# Patient Record
Sex: Male | Born: 1975 | Race: White | Hispanic: No | Marital: Married | Smoking: Never smoker
Health system: Southern US, Community
[De-identification: ages and names within clinical notes are randomized; demographics above are authoritative.]

## PROBLEM LIST (undated history)

## (undated) HISTORY — PX: FRACTURE SURGERY: SHX138

## (undated) HISTORY — PX: BACK SURGERY: SHX140

---

## 1999-05-09 ENCOUNTER — Emergency Department (HOSPITAL_COMMUNITY): Admission: EM | Admit: 1999-05-09 | Discharge: 1999-05-09 | Payer: Self-pay | Admitting: Emergency Medicine

## 1999-05-09 ENCOUNTER — Encounter: Payer: Self-pay | Admitting: Emergency Medicine

## 2005-12-07 ENCOUNTER — Encounter: Payer: Self-pay | Admitting: Emergency Medicine

## 2008-11-17 ENCOUNTER — Ambulatory Visit: Payer: Self-pay | Admitting: Radiology

## 2008-11-17 ENCOUNTER — Ambulatory Visit (HOSPITAL_BASED_OUTPATIENT_CLINIC_OR_DEPARTMENT_OTHER): Admission: RE | Admit: 2008-11-17 | Discharge: 2008-11-17 | Payer: Self-pay | Admitting: Family Medicine

## 2008-11-28 ENCOUNTER — Ambulatory Visit (HOSPITAL_BASED_OUTPATIENT_CLINIC_OR_DEPARTMENT_OTHER): Admission: RE | Admit: 2008-11-28 | Discharge: 2008-11-28 | Payer: Self-pay | Admitting: Family Medicine

## 2008-11-28 ENCOUNTER — Ambulatory Visit: Payer: Self-pay | Admitting: Diagnostic Radiology

## 2014-03-17 HISTORY — PX: KNEE ARTHROSCOPY W/ MENISCECTOMY: SHX1879

## 2015-02-10 ENCOUNTER — Encounter (HOSPITAL_BASED_OUTPATIENT_CLINIC_OR_DEPARTMENT_OTHER): Payer: Self-pay | Admitting: *Deleted

## 2015-02-16 ENCOUNTER — Encounter (HOSPITAL_BASED_OUTPATIENT_CLINIC_OR_DEPARTMENT_OTHER): Admission: RE | Payer: Self-pay | Source: Ambulatory Visit

## 2015-02-16 ENCOUNTER — Ambulatory Visit (HOSPITAL_BASED_OUTPATIENT_CLINIC_OR_DEPARTMENT_OTHER)
Admission: RE | Admit: 2015-02-16 | Payer: Worker's Compensation | Source: Ambulatory Visit | Admitting: Orthopedic Surgery

## 2015-02-16 SURGERY — ARTHROSCOPY, KNEE
Anesthesia: General | Laterality: Right

## 2021-05-07 ENCOUNTER — Other Ambulatory Visit: Payer: Self-pay | Admitting: Orthopaedic Surgery

## 2021-05-07 DIAGNOSIS — M238X1 Other internal derangements of right knee: Secondary | ICD-10-CM

## 2021-05-20 ENCOUNTER — Other Ambulatory Visit: Payer: Self-pay

## 2021-05-20 ENCOUNTER — Ambulatory Visit
Admission: RE | Admit: 2021-05-20 | Discharge: 2021-05-20 | Disposition: A | Discharge status: Home / Self Care | Payer: Self-pay | Source: Ambulatory Visit | Attending: Orthopaedic Surgery | Admitting: Orthopaedic Surgery

## 2021-05-20 DIAGNOSIS — M238X1 Other internal derangements of right knee: Secondary | ICD-10-CM

## 2021-08-29 ENCOUNTER — Emergency Department (HOSPITAL_BASED_OUTPATIENT_CLINIC_OR_DEPARTMENT_OTHER): Payer: No Typology Code available for payment source

## 2021-08-29 ENCOUNTER — Other Ambulatory Visit: Payer: Self-pay

## 2021-08-29 ENCOUNTER — Encounter (HOSPITAL_BASED_OUTPATIENT_CLINIC_OR_DEPARTMENT_OTHER): Payer: Self-pay | Admitting: Emergency Medicine

## 2021-08-29 ENCOUNTER — Emergency Department (HOSPITAL_BASED_OUTPATIENT_CLINIC_OR_DEPARTMENT_OTHER)
Admission: EM | Admit: 2021-08-29 | Discharge: 2021-08-29 | Disposition: A | Discharge status: Home / Self Care | Payer: No Typology Code available for payment source | Attending: Emergency Medicine | Admitting: Emergency Medicine

## 2021-08-29 DIAGNOSIS — W010XXA Fall on same level from slipping, tripping and stumbling without subsequent striking against object, initial encounter: Secondary | ICD-10-CM | POA: Insufficient documentation

## 2021-08-29 DIAGNOSIS — S2232XA Fracture of one rib, left side, initial encounter for closed fracture: Secondary | ICD-10-CM | POA: Insufficient documentation

## 2021-08-29 DIAGNOSIS — W19XXXA Unspecified fall, initial encounter: Secondary | ICD-10-CM

## 2021-08-29 DIAGNOSIS — S299XXA Unspecified injury of thorax, initial encounter: Secondary | ICD-10-CM | POA: Diagnosis present

## 2021-08-29 MED ORDER — LIDOCAINE 5 % EX PTCH: 1.0000 | MEDICATED_PATCH | CUTANEOUS | 0 refills | Status: DC

## 2021-08-29 MED ORDER — HYDROCODONE-ACETAMINOPHEN 5-325 MG PO TABS
1.0000 | ORAL_TABLET | Freq: Once | ORAL | Status: AC
  Administered 2021-08-29: 1 via ORAL
  Filled 2021-08-29: qty 1

## 2021-08-29 MED ORDER — LIDOCAINE 5 % EX PTCH
1.0000 | MEDICATED_PATCH | CUTANEOUS | Status: DC
  Administered 2021-08-29: 1 via TRANSDERMAL
  Filled 2021-08-29: qty 1

## 2021-08-29 MED ORDER — IBUPROFEN 800 MG PO TABS
800.0000 mg | ORAL_TABLET | Freq: Once | ORAL | Status: AC
  Administered 2021-08-29: 800 mg via ORAL
  Filled 2021-08-29: qty 1

## 2021-08-29 MED ORDER — HYDROCODONE-ACETAMINOPHEN 5-325 MG PO TABS: 1.0000 | ORAL_TABLET | ORAL | 0 refills | Status: DC | PRN

## 2021-08-29 NOTE — ED Triage Notes (Signed)
Pt arrives pov, ambulatory, slow gait, c/o mechanical fall yesterday after slipping. C/o mid back pain. Pt reports "feeling pop" in left rib when sneezing this am. Denies loc, denies hitting head.

## 2021-08-29 NOTE — ED Notes (Signed)
Patient instructed on the use of IS. Patient had good effort.

## 2021-08-29 NOTE — Discharge Instructions (Addendum)
Please return to the ED with any new or worsening symptoms such as extensive bruising to the left side chest wall, weakness, lightheadedness, fevers, productive cough I have sent the pain medication as requested to the pharmacy.  Please pick this up your earliest convenience.  Please do not drive or operate machinery while on this medication. I have also prescribed you lidocaine patches.  Please apply these once a day to the area of the left side of the chest wall for pain relief Please take 600 mg of ibuprofen and alternate with 1000 mg of Tylenol every 6 hours.  Studies have shown that alternating between these 2 medications provides the best pain relief. Please utilize the incentive spirometer I provided.  The respiratory therapist has provided instructions on its use.  Please use this during every commercial break if you are watching TV, or 10 times an hour Please utilize ice packs and heat packs to the affected side for pain relief.

## 2021-08-29 NOTE — ED Provider Notes (Signed)
[facility] EMERGENCY DEPARTMENT Provider Note   CSN: [id number] Arrival date & time: 08/29/21  1205     History  Chief Complaint  Patient presents with   Jerry Molina is a 46 y.o. male with medical history significant for back surgery, knee surgery.  Patient states yesterday he was in his usual state of health walking down the stairs when he slipped and fell onto his left side near his ribs, lower back.  The patient denies hitting his head or losing consciousness.  Patient states since this time he had extreme pain to the left side near his ribs that is exacerbated when taking large breaths.  Patient states that this morning, he was brushing his teeth at his sink when he sneezed and felt a "pop" that caused him extreme pain.  Patient states that because of this him and his wife are present to the ED for evaluation.  Patient endorses left-sided chest wall pain.  Patient denies headache, shortness of breath, centralized chest pain, loss of consciousness, nausea, vomiting.   Fall Pertinent negatives include no chest pain, no abdominal pain, no headaches and no shortness of breath.      Home Medications Prior to Admission medications   Medication Sig Start Date End Date Taking? Authorizing Provider  HYDROcodone-acetaminophen (NORCO/VICODIN) 5-325 MG tablet Take 1 tablet by mouth every 4 (four) hours as needed for moderate pain. 08/29/21  Yes Genevive Bi F, PA-C  lidocaine (LIDODERM) 5 % Place 1 patch onto the skin daily. Remove & Discard patch within 12 hours or as directed by MD 08/29/21  Yes Azucena Cecil, PA-C      Allergies    Patient has no allergy information on record.    Review of Systems   Review of Systems  Respiratory:  Negative for shortness of breath.   Cardiovascular:  Negative for chest pain.  Gastrointestinal:  Negative for abdominal pain, nausea and vomiting.  Musculoskeletal:  Positive for myalgias.       Patient reports  left-sided chest wall pain.  Neurological:  Negative for syncope and headaches.  All other systems reviewed and are negative.  Physical Exam Updated Vital Signs BP (!) 141/98 (BP Location: Right Arm)    Pulse 83    Temp 98.1 F (36.7 C) (Oral)    Resp 20    Ht 5\' 10"  (1.778 m)    Wt 95.3 kg    SpO2 99%    BMI 30.13 kg/m  Physical Exam Vitals and nursing note reviewed.  Constitutional:      General: He is not in acute distress.    Appearance: He is not ill-appearing, toxic-appearing or diaphoretic.  HENT:     Head: Normocephalic and atraumatic.     Mouth/Throat:     Mouth: Mucous membranes are moist.  Eyes:     Extraocular Movements: Extraocular movements intact.     Pupils: Pupils are equal, round, and reactive to light.  Cardiovascular:     Rate and Rhythm: Normal rate and regular rhythm.  Pulmonary:     Effort: Pulmonary effort is normal. No respiratory distress.     Breath sounds: Normal breath sounds. No stridor. No wheezing or rhonchi.  Chest:     Chest wall: Tenderness (Left-sided chest wall tenderness to palpation) present. No deformity.    Abdominal:     General: Abdomen is flat. There is no distension.     Palpations: Abdomen is soft. There is no mass.  Tenderness: There is no abdominal tenderness. There is no guarding.     Hernia: No hernia is present.  Musculoskeletal:     Cervical back: Normal range of motion. No rigidity or tenderness.  Skin:    General: Skin is warm and dry.     Findings: Bruising (Bruising noted over left side chest wall) present.  Neurological:     General: No focal deficit present.     Mental Status: He is alert and oriented to person, place, and time.    ED Results / Procedures / Treatments   Labs (all labs ordered are listed, but only abnormal results are displayed) Labs Reviewed - No data to display  EKG None  Radiology DG Ribs Unilateral W/Chest Left  Result Date: 08/29/2021 CLINICAL DATA:  Fall, rib pain EXAM: LEFT RIBS  AND CHEST - 3+ VIEW COMPARISON:  None. FINDINGS: No fracture or other bone lesions are seen involving the ribs. There is no evidence of pneumothorax or pleural effusion. Both lungs are clear. Heart size and mediastinal contours are within normal limits. IMPRESSION: No acute fracture or acute intrathoracic process identified. Electronically Signed   By: Ofilia Neas M.D.   On: 08/29/2021 13:09    Procedures Procedures    Medications Ordered in ED Medications  lidocaine (LIDODERM) 5 % 1 patch (1 patch Transdermal Patch Applied 08/29/21 1432)  ibuprofen (ADVIL) tablet 800 mg (has no administration in time range)  HYDROcodone-acetaminophen (NORCO/VICODIN) 5-325 MG per tablet 1 tablet (1 tablet Oral Given 08/29/21 1431)    ED Course/ Medical Decision Making/ A&P                           Medical Decision Making Amount and/or Complexity of Data Reviewed Radiology: ordered.  Risk Prescription drug management.   This is a pleasant 46 year old male, no significant medical history, who presents with his wife after suffering mechanical fall downstairs day prior.  Patient now complaining of left-sided chest wall pain consistent with the area of his ribs.  On examination, there is overlying ecchymosis and bruising to the ribs.  There is extreme tenderness to palpation.  The patient states that he has a hard time taking deep breaths.  Patient was worked up utilizing unilateral left-sided rib plain film.  I personally interpreted this film and did not see any evidence of fractures.  However, due to the patient's pain and hearing a "pop" noise this morning I feel comfortable diagnosing patient with a rib fracture clinically.  I provided the patient with instructions on how to splint and take deep breaths.  I provided the patient with a an incentive spirometer and instructed him on how to use it.  I have also provided the patient with pain relief medication.   I feel this patient is stable for  discharge at this time.  His blood pressure has been slightly high while here with the latest recorded blood pressure being 167/105.  Patient denies any previous diagnosis of hypertension, I suspect his blood pressure is high due to the pain.  I provided the patient with return precautions to watch out for such as chest pain, shortness of breath, back pain, headaches, blurred vision.  The patient reports understanding of these instructions.  I provided the patient with return precautions and he voices understanding.  I answered all the questions of the patient his satisfaction.  The patient is safe for discharge at this time.    Final Clinical Impression(s) / ED  Diagnoses Final diagnoses:  Closed fracture of one rib of left side, initial encounter  Fall, initial encounter    Rx / DC Orders ED Discharge Orders          Ordered    HYDROcodone-acetaminophen (NORCO/VICODIN) 5-325 MG tablet  Every 4 hours PRN        08/29/21 1441    lidocaine (LIDODERM) 5 %  Every 24 hours        08/29/21 1441              Buchanan, Housen, PA-C 08/29/21 1500    Fredia Sorrow, MD 09/05/21 [phone]

## 2021-10-12 ENCOUNTER — Emergency Department (HOSPITAL_BASED_OUTPATIENT_CLINIC_OR_DEPARTMENT_OTHER)
Admission: EM | Admit: 2021-10-12 | Discharge: 2021-10-12 | Disposition: A | Discharge status: Home / Self Care | Payer: No Typology Code available for payment source | Attending: Emergency Medicine | Admitting: Emergency Medicine

## 2021-10-12 ENCOUNTER — Emergency Department (HOSPITAL_BASED_OUTPATIENT_CLINIC_OR_DEPARTMENT_OTHER): Payer: No Typology Code available for payment source

## 2021-10-12 ENCOUNTER — Other Ambulatory Visit: Payer: Self-pay

## 2021-10-12 ENCOUNTER — Encounter (HOSPITAL_BASED_OUTPATIENT_CLINIC_OR_DEPARTMENT_OTHER): Payer: Self-pay | Admitting: *Deleted

## 2021-10-12 DIAGNOSIS — M25461 Effusion, right knee: Secondary | ICD-10-CM

## 2021-10-12 DIAGNOSIS — M7989 Other specified soft tissue disorders: Secondary | ICD-10-CM | POA: Diagnosis present

## 2021-10-12 NOTE — Discharge Instructions (Addendum)
Like we discussed, I would recommend a trial of ibuprofen to see if this helps improve your symptoms.  Ice the knee as needed and elevate the leg when you can during the day and at night to help improve the swelling.  You can also consider a knee sleeve to help with compression. ? ?Please follow-up with your orthopedist regarding your symptoms as well as your visit today.  If you develop any new or worsening symptoms whatsoever please come back to the emergency department. ?

## 2021-10-12 NOTE — ED Triage Notes (Signed)
Open wound to his right leg at his knee level. He does not remember hitting his leg on any object. The next day he had swelling.  ?

## 2021-10-12 NOTE — ED Provider Notes (Signed)
[facility] EMERGENCY DEPARTMENT Provider Note   CSN: [id number] Arrival date & time: 10/12/21  1638     History  Chief Complaint  Patient presents with   Leg Injury    Jerry Molina is a 46 y.o. male.  HPI Patient is a 46 year old male who presents to the emergency department due to right lower extremity swelling.  Patient reports a history of multiple surgeries to the right knee and most recently had a right knee arthroscopy by Dr. Carolynne Edouard on September 09, 2021.  States that he has been following up outpatient and healing well.  He noticed a couple of days ago that he had a small cut overlying one of his scars and had bleeding from the region.  He cleaned the wound and this is since resolved.  No purulent discharge, redness, fevers, chills, nausea, vomiting.  He states that he is numb in the region of the previous surgical scar and due to this is unsure if he accidentally scratched the area or obtained any trauma.  He states yesterday he began noticing swelling in the right knee which he notes happens from time to time but then began developing swelling in the right lower leg, particularly the right calf.  Also notes some mild tenderness in the right calf.  No numbness in the lower leg.  No chest pain or shortness of breath.  No history of blood clots.    Home Medications Prior to Admission medications   Medication Sig Start Date End Date Taking? Authorizing Provider  HYDROcodone-acetaminophen (NORCO/VICODIN) 5-325 MG tablet Take 1 tablet by mouth every 4 (four) hours as needed for moderate pain. 08/29/21   Al Decant, PA-C  lidocaine (LIDODERM) 5 % Place 1 patch onto the skin daily. Remove & Discard patch within 12 hours or as directed by MD 08/29/21   Al Decant, PA-C      Allergies    Patient has no known allergies.    Review of Systems   Review of Systems  Constitutional:  Negative for chills and fever.  Respiratory:  Negative for shortness of  breath.   Cardiovascular:  Positive for leg swelling. Negative for chest pain.  Gastrointestinal:  Negative for nausea and vomiting.  Musculoskeletal:  Positive for joint swelling and myalgias.  Skin:  Positive for wound.  Neurological:  Negative for weakness and numbness.   Physical Exam Updated Vital Signs BP 130/85    Pulse 95    Temp 98.1 F (36.7 C) (Oral)    Resp 18    Ht 5\' 10"  (1.778 m)    Wt 95.3 kg    SpO2 98%    BMI 30.15 kg/m  Physical Exam Vitals and nursing note reviewed.  Constitutional:      General: He is not in acute distress.    Appearance: He is well-developed.  HENT:     Head: Normocephalic and atraumatic.     Right Ear: External ear normal.     Left Ear: External ear normal.  Eyes:     General: No scleral icterus.       Right eye: No discharge.        Left eye: No discharge.     Conjunctiva/sclera: Conjunctivae normal.  Neck:     Trachea: No tracheal deviation.  Cardiovascular:     Rate and Rhythm: Normal rate.  Pulmonary:     Effort: Pulmonary effort is normal. No respiratory distress.     Breath sounds: No stridor.  Abdominal:  General: Abdomen is flat. There is no distension.  Musculoskeletal:        General: Swelling and tenderness present. No deformity.     Cervical back: Neck supple.     Comments: Right knee: Multiple well-healing surgical scars noted.  Patient has a small surgical scar from a recent arthroscopy to the right lateral knee with a well-healing abrasion noted overlying the region.  No active bleeding.  No fluctuance or tenderness.  Mild to moderate soft tissue swelling noted circumferentially in the right knee.  No tenderness appreciated in the right knee.  No pain with range of motion.  Full range of motion of the joint.  1+ edema noted in the right lower leg from the ankle to the upper calf.  Mild tenderness noted in the right calf.  No palpable cords.  2+ DP pulses.  Distal sensation intact.  Skin:    General: Skin is warm and  dry.     Findings: No rash.  Neurological:     Mental Status: He is alert.     Cranial Nerves: Cranial nerve deficit: no gross deficits.   ED Results / Procedures / Treatments   Labs (all labs ordered are listed, but only abnormal results are displayed) Labs Reviewed - No data to display  EKG None  Radiology US Venous Img Lower Unilateral Right  Result Date: 10/12/2021 CLINICAL DATA:  Right lower extremity swelling. EXAM: Right LOWER EXTREMITY VENOUS DOPPLER ULTRASOUND TECHNIQUE: Gray-scale sonography with compression, as well as color and duplex ultrasound, were performed to evaluate the deep venous system(s) from the level of the common femoral vein through the popliteal and proximal calf veins. COMPARISON:  None. FINDINGS: VENOUS Normal compressibility of the common femoral, superficial femoral, and popliteal veins, as well as the visualized calf veins. Visualized portions of profunda femoral vein and great saphenous vein unremarkable. No filling defects to suggest DVT on grayscale or color Doppler imaging. Doppler waveforms show normal direction of venous flow, normal respiratory plasticity and response to augmentation. Limited views of the contralateral common femoral vein are unremarkable. OTHER None. Limitations: none IMPRESSION: Negative. Electronically Signed   By: Lupita Raider M.D.   On: 10/12/2021 17:39   DG Knee Complete 4 Views Right  Result Date: 10/12/2021 CLINICAL DATA:  Recent surgery.  Swelling. EXAM: RIGHT KNEE - COMPLETE 4+ VIEW COMPARISON:  MRI 05/20/2021 reviewed FINDINGS: Joint space narrowing of the lateral tibiofemoral compartment. Tricompartmental peripheral spurring. Postsurgical change in the distal femur with multiple ghost tracks. Previous intra-articular bodies on MRI are not definitively seen on the current exam. No fracture. No erosive change. Small knee joint effusion. Generalized soft tissue edema IMPRESSION: 1. Generalized soft tissue edema. Small knee joint  effusion. 2. 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 Electronically Signed   By: Narda Rutherford M.D.   On: 10/12/2021 17:45    Procedures Procedures   Medications Ordered in ED Medications - No data to display  ED Course/ Medical Decision Making/ A&P                           Medical Decision Making Amount and/or Complexity of Data Reviewed Radiology: ordered. ECG/medicine tests: ordered.  Pt is a 46 y.o. male who presents to the emergency department due to right leg swelling.  Imaging: X-ray of the right knee shows generalized soft tissue edema.  Small joint effusion.  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 DVT ultrasound  of the right leg is negative.  I, Placido Sou, PA-C, personally reviewed and evaluated these images and lab results as part of my medical decision-making.  Unsure the source of the patient's symptoms.  Appear to likely be MSK in nature.  He does note that he does walks and exercises frequently.  Unsure of any recent injuries.  He does have some circumferential swelling of the right knee but no palpable tenderness or overlying skin changes concerning for infection.  He does note that at times he does get swelling in the right knee and this is not a new symptom.  Full range of motion of the right knee without pain.  Afebrile, nontachycardic, and nontoxic-appearing.  Doubt septic joint at this time.  Given his arthroscopy last month as well as his lower extremity swelling and calf pain I obtained an ultrasound as well which was also reassuring.  Feel the patient is stable for discharge at this time and he is agreeable.  Recommended a trial of NSAIDs.  RICE method.  Follow-up with Ortho.  We discussed return precautions.  His questions were answered and he was amicable at the time of discharge.  Note: Portions of this report may have been transcribed using voice  recognition software. Every effort was made to ensure accuracy; however, inadvertent computerized transcription errors may be present.   Final Clinical Impression(s) / ED Diagnoses Final diagnoses:  Swelling of joint of right knee  Right leg swelling   Rx / DC Orders ED Discharge Orders     None         Placido Sou, PA-C 10/12/21 1756    Rolan Bucco, MD 10/12/21 2316

## 2022-05-02 ENCOUNTER — Encounter: Payer: Self-pay | Admitting: Gastroenterology

## 2022-06-08 ENCOUNTER — Ambulatory Visit: Payer: No Typology Code available for payment source | Admitting: Gastroenterology

## 2023-09-04 IMAGING — US US EXTREM LOW VENOUS*R*
1 series · 14 of 24 positions shown · non-contrast
Comparison: None.

CLINICAL DATA: Right lower extremity swelling.

EXAM:
Right LOWER EXTREMITY VENOUS DOPPLER ULTRASOUND
TECHNIQUE: Gray-scale sonography with compression, as well as color and duplex
ultrasound, were performed to evaluate the deep venous system(s)
from the level of the common femoral vein through the popliteal and
proximal calf veins.

[Series 1: us extrem low venous*right* · 14 of 28 slices shown]
[im 1/28]
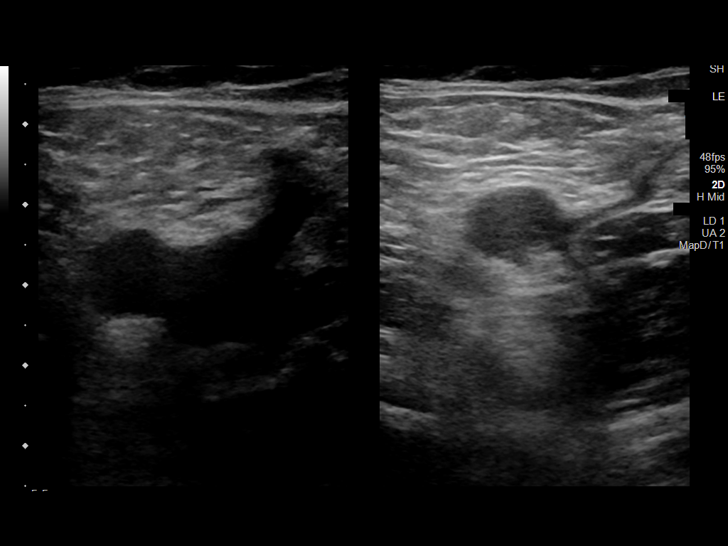
[im 3/28]
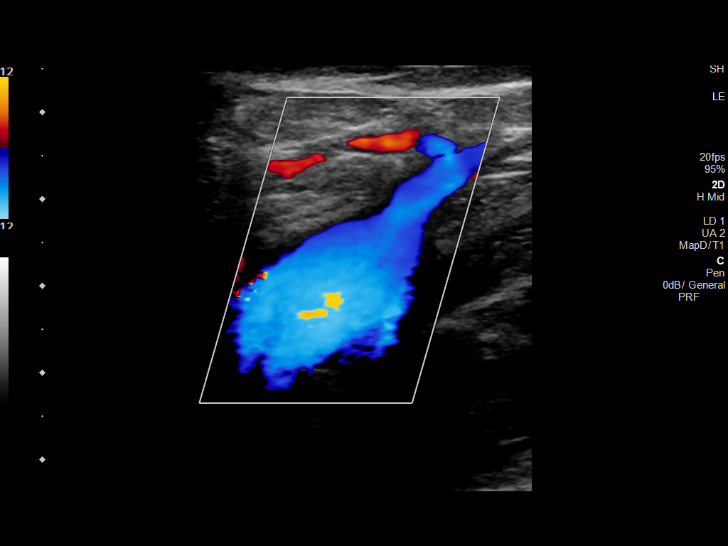
[im 5/28]
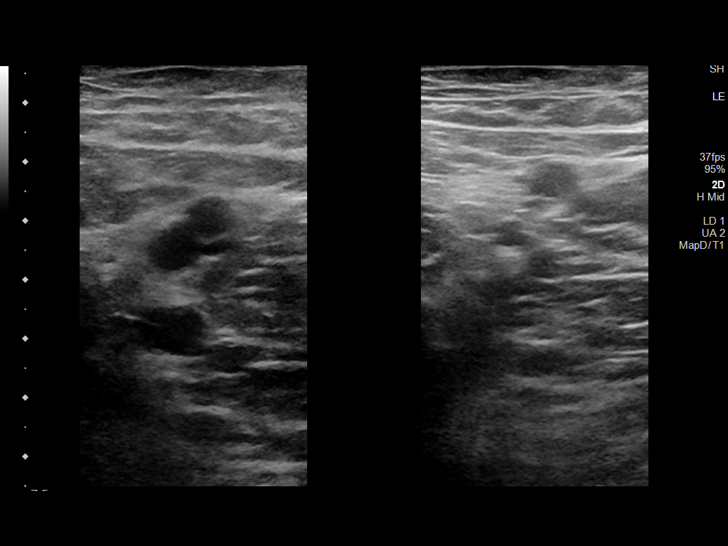
[im 8/28]
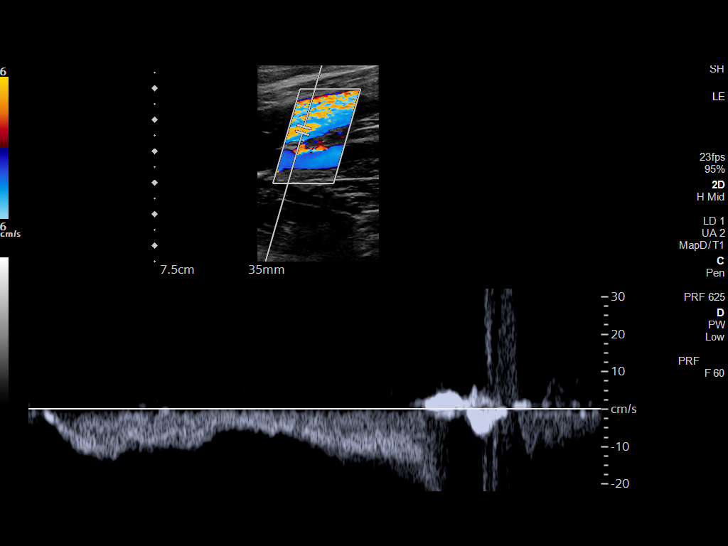
[im 9/28]
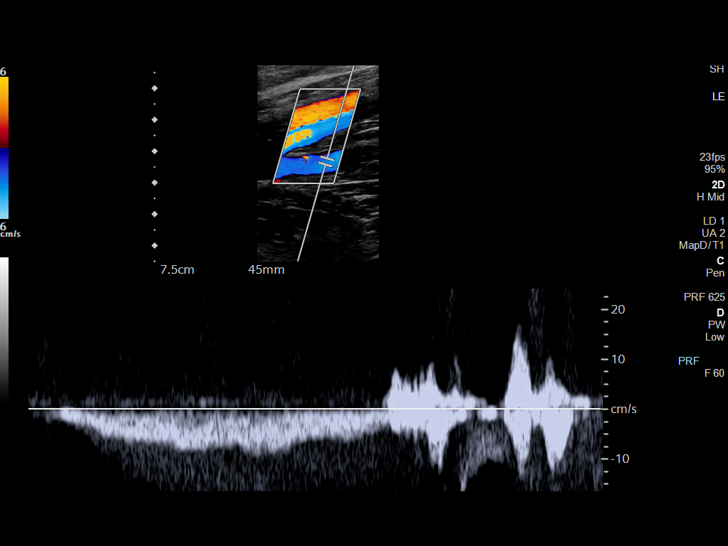
[im 11/28]
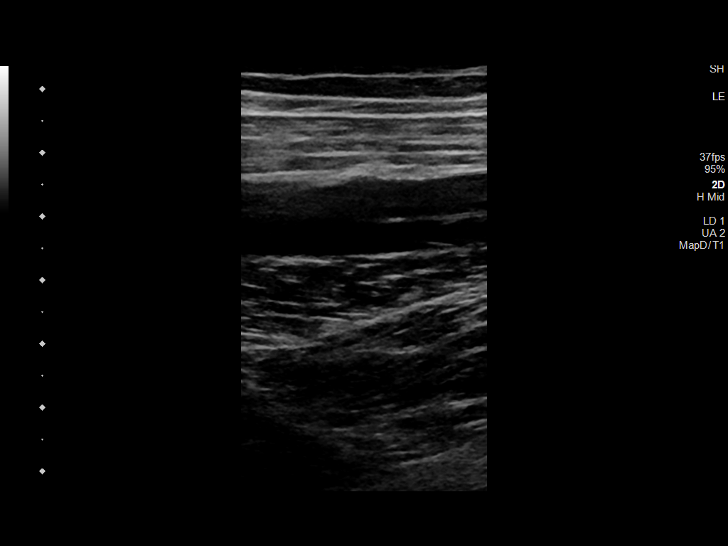
[im 13/28]
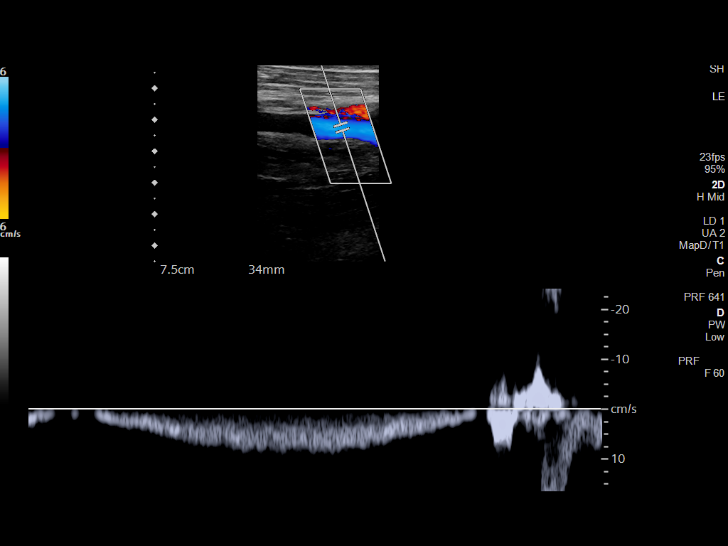
[im 15/28]
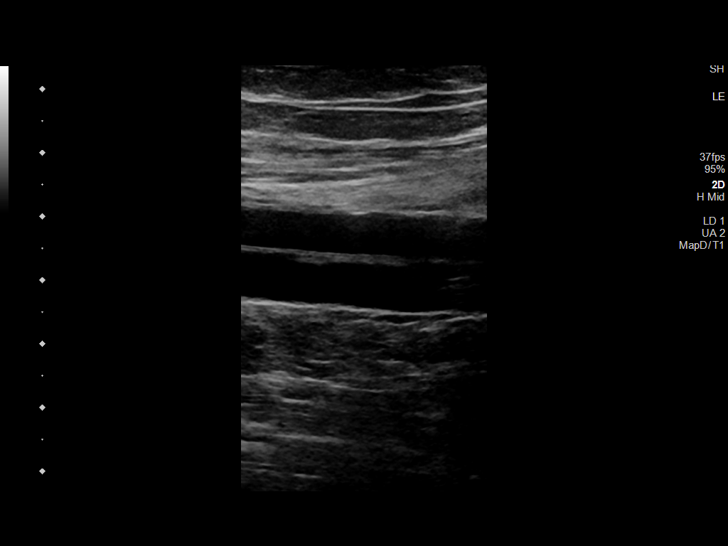
[im 17/28]
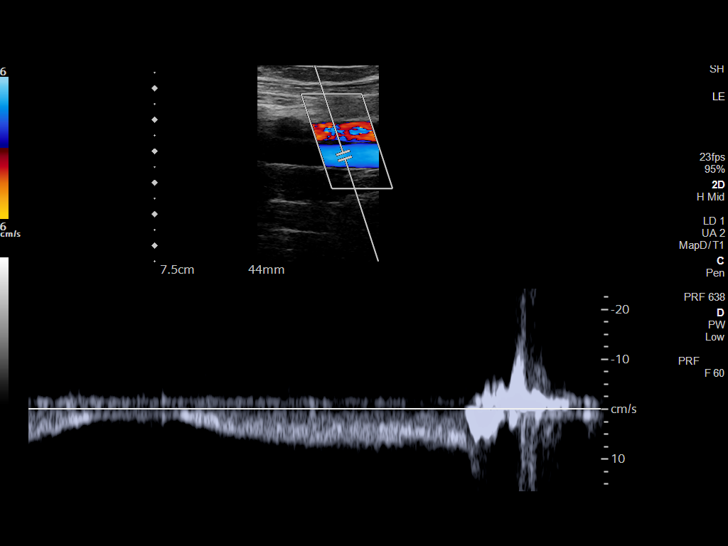
[im 19/28]
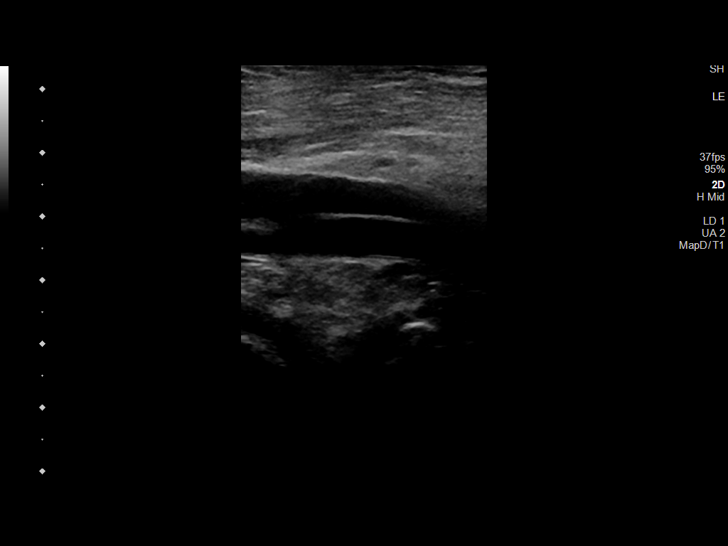
[im 22/28]
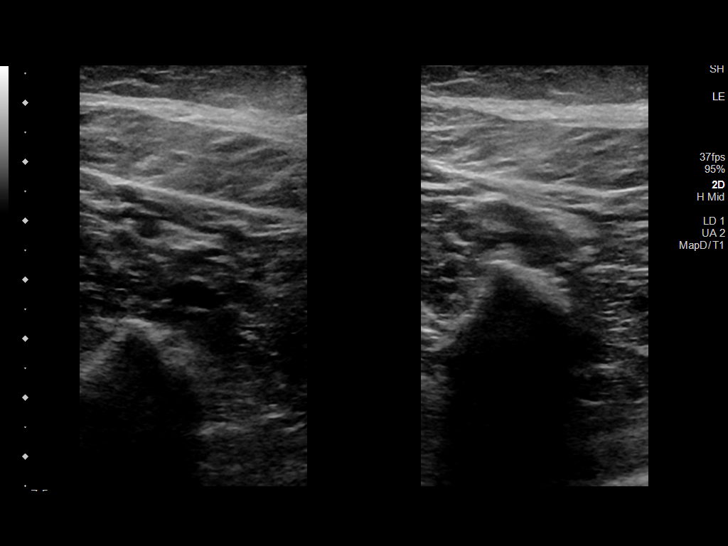
[im 23/28]
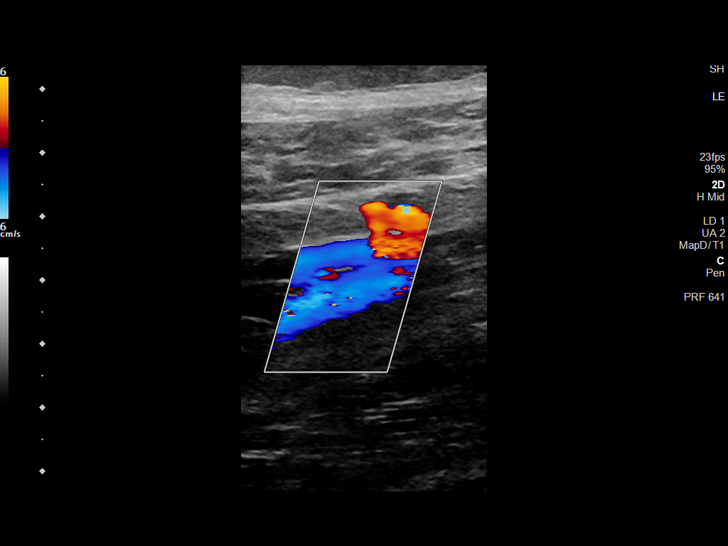
[im 25/28]
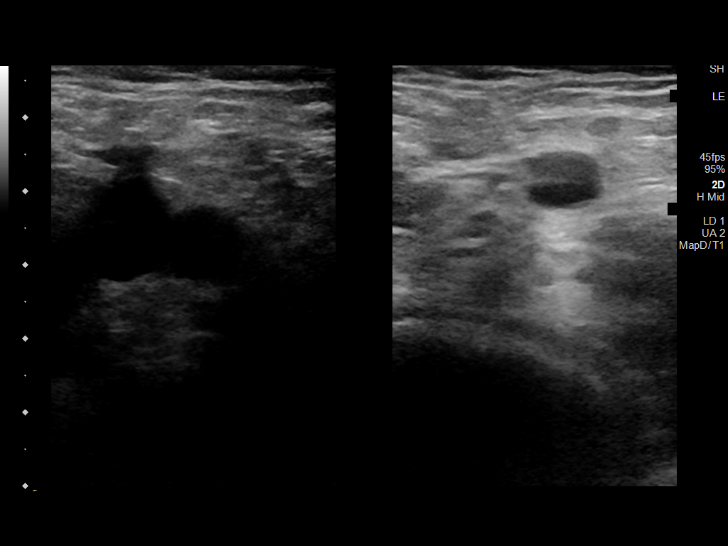
[im 28/28]
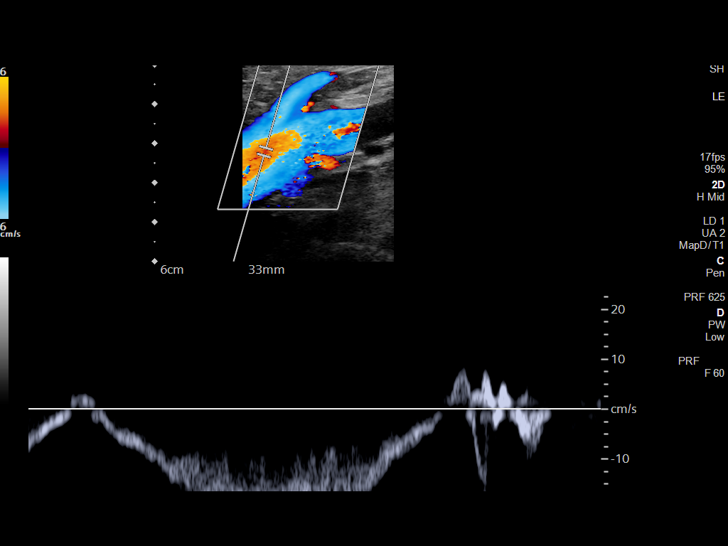

[14 of 24 positions shown; findings below may reference images not displayed]

FINDINGS: VENOUS

Normal compressibility of the common femoral, superficial femoral,
and popliteal veins, as well as the visualized calf veins.
Visualized portions of profunda femoral vein and great saphenous
vein unremarkable. No filling defects to suggest DVT on grayscale or
color Doppler imaging. Doppler waveforms show normal direction of
venous flow, normal respiratory plasticity and response to
augmentation.

Limited views of the contralateral common femoral vein are
unremarkable.

OTHER

None.

Limitations: none
IMPRESSION: Negative.

## 2024-09-04 ENCOUNTER — Encounter: Payer: Self-pay | Admitting: Internal Medicine

## 2024-09-04 ENCOUNTER — Ambulatory Visit: Admitting: Internal Medicine

## 2024-09-04 VITALS — BP 126/86 | HR 76 | Ht 70.0 in | Wt 192.4 lb

## 2024-09-04 DIAGNOSIS — Z1211 Encounter for screening for malignant neoplasm of colon: Secondary | ICD-10-CM

## 2024-09-04 DIAGNOSIS — K648 Other hemorrhoids: Secondary | ICD-10-CM

## 2024-09-04 DIAGNOSIS — K921 Melena: Secondary | ICD-10-CM | POA: Diagnosis not present

## 2024-09-04 MED ORDER — NA SULFATE-K SULFATE-MG SULF 17.5-3.13-1.6 GM/177ML PO SOLN: 1.0000 | Freq: Once | ORAL | 0 refills | Status: AC

## 2024-09-04 NOTE — Patient Instructions (Signed)
 You have been scheduled for a colonoscopy. Please follow written instructions given to you at your visit today.   If you use inhalers (even only as needed), please bring them with you on the day of your procedure.  DO NOT TAKE 7 DAYS PRIOR TO TEST- Trulicity (dulaglutide) Ozempic, Wegovy (semaglutide) Mounjaro, Zepbound (tirzepatide) Bydureon Bcise (exanatide extended release)  DO NOT TAKE 1 DAY PRIOR TO YOUR TEST Rybelsus (semaglutide) Adlyxin (lixisenatide) Victoza (liraglutide) Byetta (exanatide) _____________________________________________________________  If your blood pressure at your visit was 140/90 or greater, please contact your primary care physician to follow up on this.  _______________________________________________________  If you are age 93 or older, your body mass index should be between 23-30. Your Body mass index is 27.61 kg/m. If this is out of the aforementioned range listed, please consider follow up with your Primary Care Provider.  If you are age 10 or younger, your body mass index should be between 19-25. Your Body mass index is 27.61 kg/m. If this is out of the aformentioned range listed, please consider follow up with your Primary Care Provider.   ________________________________________________________  The [facility] providers would like to encourage you to use MYCHART to communicate with providers for non-urgent requests or questions.  Due to long hold times on the telephone, sending your provider a message by [facility] may be a faster and more efficient way to get a response.  Please allow 48 business hours for a response.  Please remember that this is for non-urgent requests.  _______________________________________________________  [facility] is using a team-based approach to care.  Your team is made up of your doctor and two to three APPS. Our APPS (Nurse Practitioners and Physician Assistants) work with your physician to ensure care  continuity for you. They are fully qualified to address your health concerns and develop a treatment plan. They communicate directly with your gastroenterologist to care for you. Seeing the Advanced Practice Practitioners on your physician's team can help you by facilitating care more promptly, often allowing for earlier appointments, access to diagnostic testing, procedures, and other specialty referrals.

## 2024-09-04 NOTE — Progress Notes (Signed)
 Patient ID: Jerry Molina, male   DOB: 09/21/1975, 49 y.o.   MRN: [id number] HPI:  Jerry Molina is a 49 year old male who presents for screening colonoscopy and to discuss intermittent painless rectal bleeding.  Intermittent painless rectal bleeding began approximately two years ago, initially presenting as bright red blood on toilet paper without associated changes in bowel habits or other symptoms. The bleeding resolved after the initial episode, prompting cancellation of a previously scheduled colonoscopy.  Since then, he has experienced occasional recurrences of painless, bright red blood on toilet paper. Bleeding episodes occur without warning and are not accompanied by perianal pain, irritation, stinging, burning, or any abnormal sensation. No dark or tarry stools have been noted. Bowel habits remain regular, and there are no complaints of abdominal pain, heartburn, dysphagia, or upper gastrointestinal symptoms. He tolerates spicy foods without issue.  He denies any personal or family history of colon cancer or other colon problems.       History reviewed. No pertinent past medical history.  Past Surgical History:  Procedure Laterality Date   BACK SURGERY     FRACTURE SURGERY     KNEE ARTHROSCOPY W/ MENISCECTOMY Right 03/17/2014   lateral meniscect.    Outpatient Medications Prior to Visit  Medication Sig Dispense Refill  None  No facility-administered medications prior to visit.    Allergies[1]  Family History  Problem Relation Age of Onset   Hypertension Father    Diabetes Maternal Uncle     Social History[2]  ROS: As per history of present illness, otherwise negative  BP 126/86   Pulse 76   Ht 5' 10 (1.778 m)   Wt 192 lb 6.4 oz (87.3 kg)   BMI 27.61 kg/m  Gen: awake, alert, NAD HEENT: anicteric  Abd: soft, NT/ND, +BS throughout Ext: no c/c/e Neuro: nonfocal      ASSESSMENT/PLAN:     Colorectal cancer screening He is suitable for  average-risk colorectal cancer screening at age 24 with no family history or symptoms. Colonoscopy indicated for cancer prevention via polypectomy, with risks including bleeding, infection, missed lesions, perforation, and anesthesia-related complications.  - Scheduled screening colonoscopy. - Reviewed bowel preparation: split-dose regimen, clear liquids day prior, NPO day of procedure. - Discussed need for post-procedure driver due to sedation. - Provided consent form, reviewed risks, benefits, alternatives. - Discussed intra-procedural polypectomy if polyps found. - Reviewed post-polyp surveillance intervals: 3, 5, 7 years based on findings; 10 years if normal. - Discussed colonoscopy surveys for polyps, inflammation, abnormalities. - Reviewed anticipated outcomes: cancer prevention via polyp removal. - Discussed risks: bleeding, infection, missed lesions, perforation, anesthesia-related risks. - Procedure typically well tolerated with sedation for comfort.  Internal hemorrhoids Mild, intermittent, painless hematochezia consistent with internal hemorrhoids. Symptoms not bothersome, observation appropriate. Reassured about benign nature, discussed future treatment if symptoms worsen. - Evaluate for internal hemorrhoids during colonoscopy to confirm diagnosis. - Provided reassurance regarding benign nature of internal hemorrhoidal bleeding. - Discussed rubber band ligation as future treatment if symptoms worsen. - Current management is observation and reassurance.        Rr:Dyjt, Elsie JONETTA Raddle., Md [address] [place],  [place] [address]      [1] No Known Allergies [2]  Social History Tobacco Use   Smoking status: Never   Smokeless tobacco: Never  Vaping Use   Vaping status: Never Used  Substance Use Topics   Alcohol use: Yes   Drug use: Never

## 2024-10-16 ENCOUNTER — Encounter: Admitting: Internal Medicine
